# Patient Record
Sex: Female | Born: 1960 | Race: White | Hispanic: No
Health system: Southern US, Community
[De-identification: ages and names within clinical notes are randomized; demographics above are authoritative.]

---

## 1999-02-03 ENCOUNTER — Other Ambulatory Visit: Admission: RE | Admit: 1999-02-03 | Discharge: 1999-02-03 | Payer: Self-pay | Admitting: Gynecology

## 2002-03-25 ENCOUNTER — Other Ambulatory Visit: Admission: RE | Admit: 2002-03-25 | Discharge: 2002-03-25 | Payer: Self-pay | Admitting: Obstetrics & Gynecology

## 2003-04-23 ENCOUNTER — Other Ambulatory Visit: Admission: RE | Admit: 2003-04-23 | Discharge: 2003-04-23 | Payer: Self-pay | Admitting: Obstetrics & Gynecology

## 2004-07-11 ENCOUNTER — Other Ambulatory Visit: Admission: RE | Admit: 2004-07-11 | Discharge: 2004-07-11 | Payer: Self-pay | Admitting: Obstetrics & Gynecology

## 2005-08-30 ENCOUNTER — Other Ambulatory Visit: Admission: RE | Admit: 2005-08-30 | Discharge: 2005-08-30 | Payer: Self-pay | Admitting: Obstetrics & Gynecology

## 2014-05-01 ENCOUNTER — Other Ambulatory Visit: Payer: Self-pay | Admitting: Obstetrics & Gynecology

## 2014-05-01 DIAGNOSIS — R928 Other abnormal and inconclusive findings on diagnostic imaging of breast: Secondary | ICD-10-CM

## 2014-05-06 ENCOUNTER — Other Ambulatory Visit: Payer: Self-pay | Admitting: Obstetrics & Gynecology

## 2014-05-06 ENCOUNTER — Ambulatory Visit
Admission: RE | Admit: 2014-05-06 | Discharge: 2014-05-06 | Disposition: A | Discharge status: Home / Self Care | Payer: No Typology Code available for payment source | Source: Ambulatory Visit | Attending: Obstetrics & Gynecology | Admitting: Obstetrics & Gynecology

## 2014-05-06 DIAGNOSIS — N6320 Unspecified lump in the left breast, unspecified quadrant: Secondary | ICD-10-CM

## 2014-05-06 DIAGNOSIS — R928 Other abnormal and inconclusive findings on diagnostic imaging of breast: Secondary | ICD-10-CM

## 2014-05-13 ENCOUNTER — Other Ambulatory Visit: Payer: Self-pay | Admitting: Obstetrics & Gynecology

## 2014-05-13 DIAGNOSIS — N6320 Unspecified lump in the left breast, unspecified quadrant: Secondary | ICD-10-CM

## 2014-05-15 ENCOUNTER — Ambulatory Visit
Admission: RE | Admit: 2014-05-15 | Discharge: 2014-05-15 | Disposition: A | Discharge status: Home / Self Care | Payer: No Typology Code available for payment source | Source: Ambulatory Visit | Attending: Obstetrics & Gynecology | Admitting: Obstetrics & Gynecology

## 2014-05-15 DIAGNOSIS — N6320 Unspecified lump in the left breast, unspecified quadrant: Secondary | ICD-10-CM

## 2016-04-04 ENCOUNTER — Other Ambulatory Visit: Payer: Self-pay | Admitting: Obstetrics & Gynecology

## 2016-04-05 LAB — CYTOLOGY - PAP

## 2020-01-05 ENCOUNTER — Other Ambulatory Visit: Payer: Self-pay | Admitting: Obstetrics & Gynecology

## 2020-01-05 DIAGNOSIS — R928 Other abnormal and inconclusive findings on diagnostic imaging of breast: Secondary | ICD-10-CM

## 2020-01-08 ENCOUNTER — Ambulatory Visit
Admission: RE | Admit: 2020-01-08 | Discharge: 2020-01-08 | Disposition: A | Discharge status: Home / Self Care | Payer: No Typology Code available for payment source | Source: Ambulatory Visit | Attending: Obstetrics & Gynecology | Admitting: Obstetrics & Gynecology

## 2020-01-08 ENCOUNTER — Other Ambulatory Visit: Payer: Self-pay

## 2020-01-08 DIAGNOSIS — R928 Other abnormal and inconclusive findings on diagnostic imaging of breast: Secondary | ICD-10-CM

## 2021-08-11 IMAGING — MG MM DIGITAL DIAGNOSTIC UNILAT*L* W/ TOMO W/ CAD
5 series · 6 of 13 positions shown · non-contrast
Comparison: Previous exams including recent screening mammogram
dated 01/01/2020.

CLINICAL DATA: Patient returns today to evaluate a possible LEFT
breast asymmetry questioned on recent screening mammogram.

EXAM:
DIGITAL DIAGNOSTIC LEFT MAMMOGRAM WITH CAD AND TOMO
ULTRASOUND LEFT BREAST

[L CC synth-2D (1 of 2)]
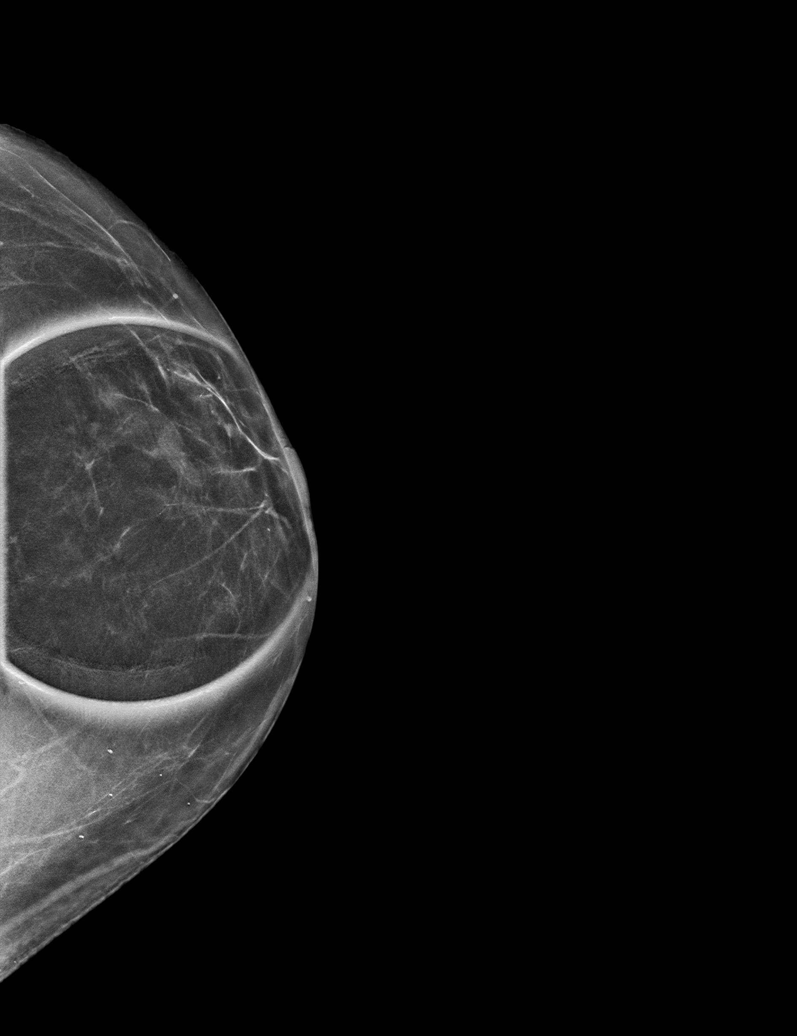

[L CC synth-2D (2 of 2)]
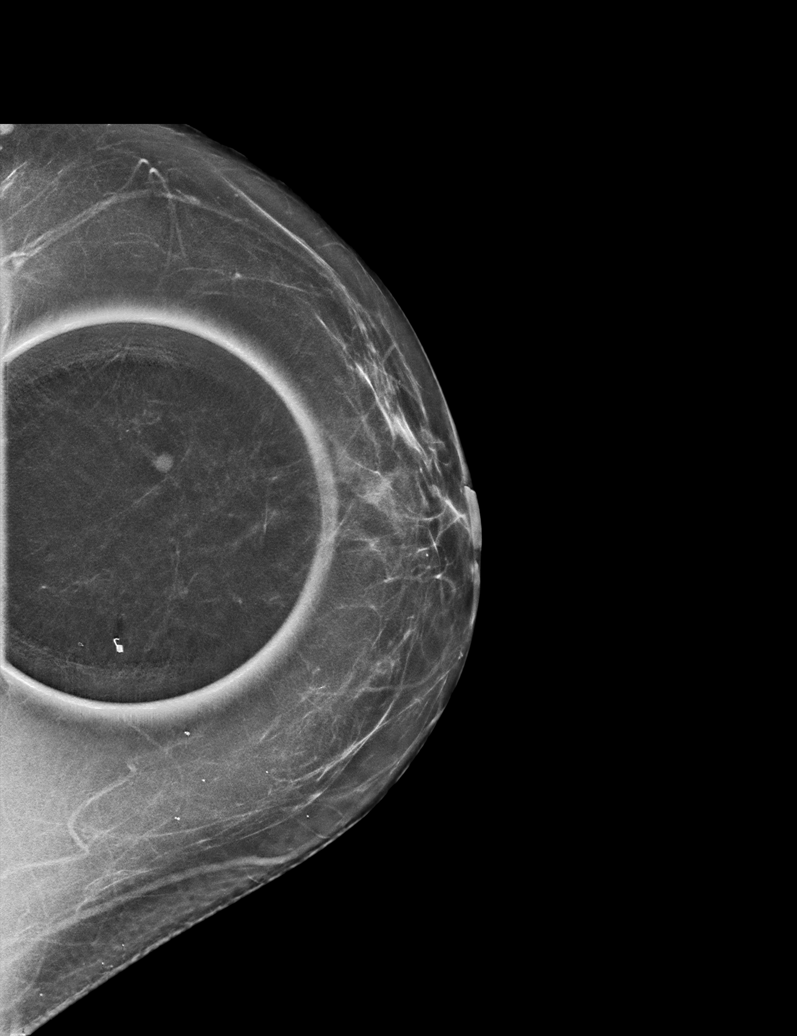

[L MLO synth-2D]
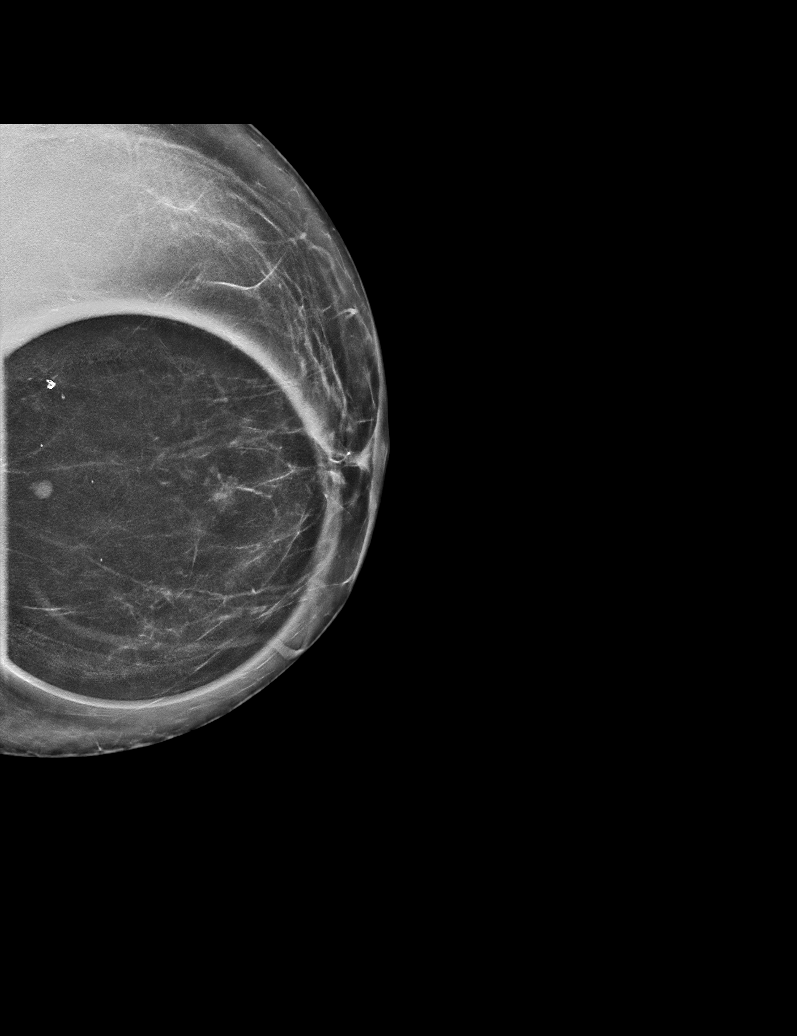

[L CC tomo · 2 of 54 frames shown]
[frame 18/54]
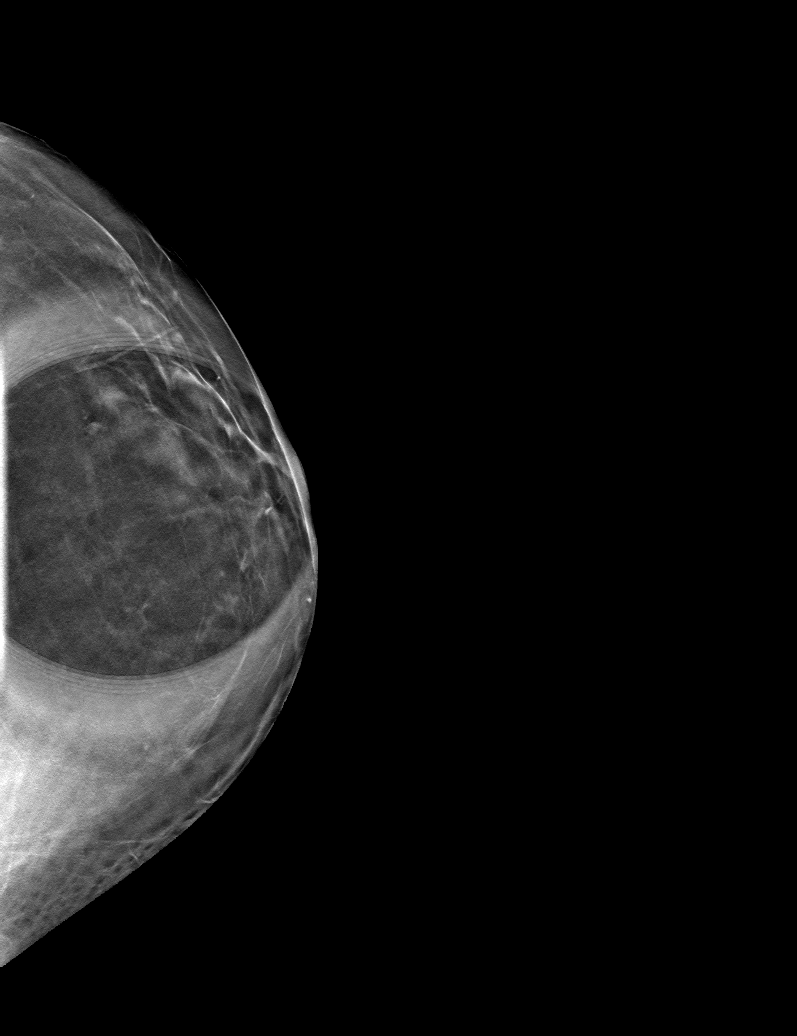
[frame 27/54]
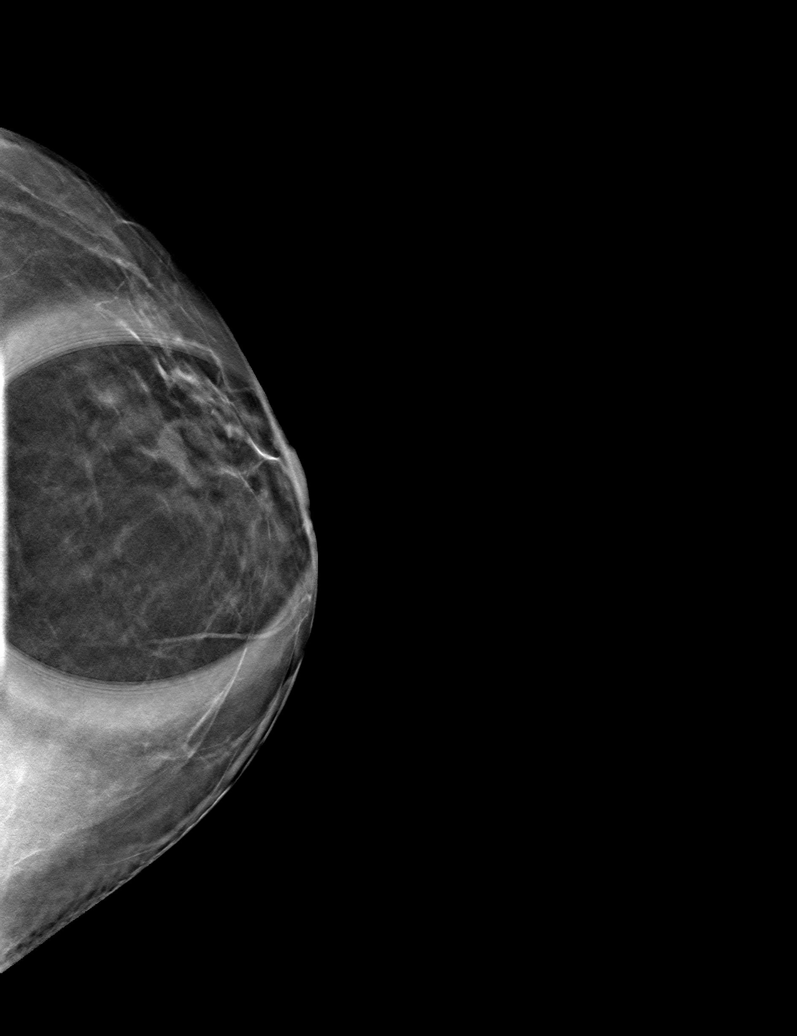

[L MLO tomo · tomo slice 34/67.0]
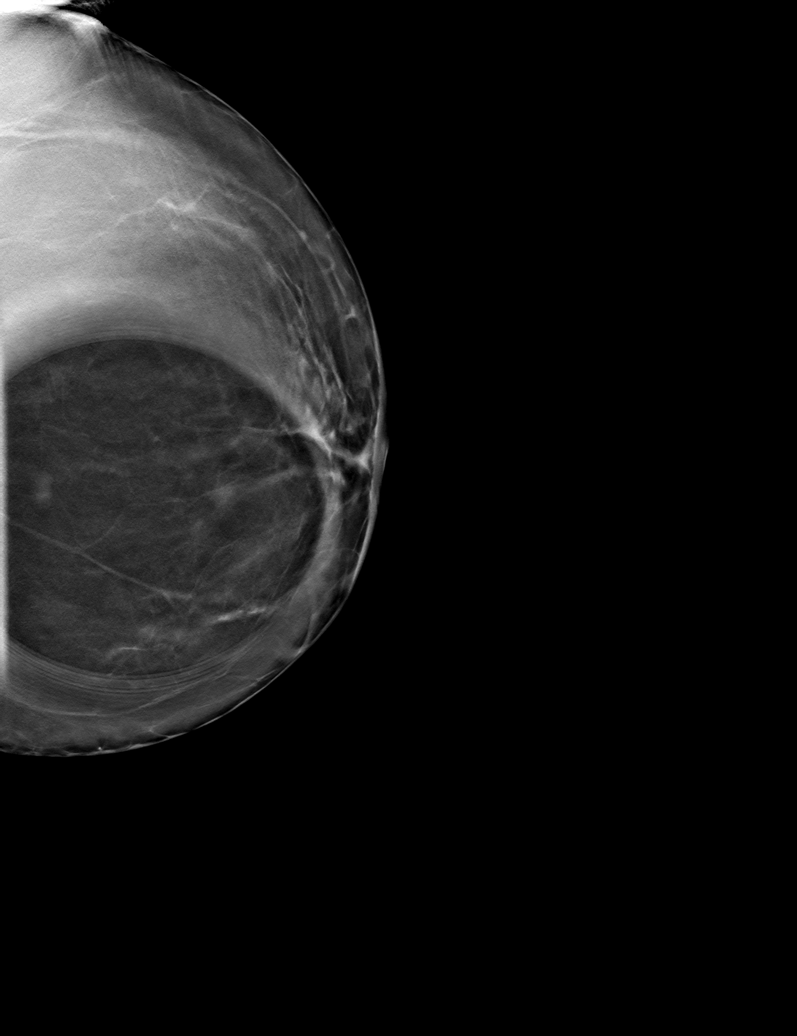

[6 of 13 positions shown; findings below may reference images not displayed]

ACR Breast Density Category b: There are scattered areas of
fibroglandular density.
FINDINGS: There is a partially obscured mass confirmed within the slightly
outer LEFT breast, at anterior depth, measuring approximately 8 mm
greatest dimension, spot compression MLO slice 55.

Mammographic images were processed with CAD.

Targeted ultrasound is performed, showing a benign cluster of cysts
in the LEFT breast at the 5 o'clock axis, 2 cm from nipple,
measuring 7 x 3 x 6 mm, corresponding to the mammographic finding.

There are additional benign cysts in the LEFT breast at the 5
o'clock axis and 6 o'clock axis, corresponding as incidental
findings.
IMPRESSION: No evidence of malignancy. Multiple benign cysts within the LEFT
breast, including a 7 mm cluster of cysts which corresponds to the
mammographic finding.

Patient may return to routine annual bilateral screening mammogram
schedule.

RECOMMENDATION:
Screening mammogram in one year.(Code:[id number])

I have discussed the findings and recommendations with the patient.
If applicable, a reminder letter will be sent to the patient
regarding the next appointment.

BI-RADS CATEGORY  2: Benign.
